# Patient Record
Sex: Male | Born: 1985 | Race: Black or African American | Hispanic: No | Marital: Single | Smoking: Current every day smoker
Health system: Southern US, Community
[De-identification: ages and names within clinical notes are randomized; demographics above are authoritative.]

## PROBLEM LIST (undated history)

## (undated) HISTORY — PX: APPENDECTOMY: SHX54

---
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
## 2020-09-29 ENCOUNTER — Emergency Department (HOSPITAL_COMMUNITY): Payer: Self-pay

## 2020-09-29 ENCOUNTER — Emergency Department (HOSPITAL_COMMUNITY)
Admission: EM | Admit: 2020-09-29 | Discharge: 2020-09-29 | Disposition: A | Discharge status: Home / Self Care | Payer: Self-pay | Attending: Emergency Medicine | Admitting: Emergency Medicine

## 2020-09-29 ENCOUNTER — Other Ambulatory Visit: Payer: Self-pay

## 2020-09-29 DIAGNOSIS — S71102A Unspecified open wound, left thigh, initial encounter: Secondary | ICD-10-CM | POA: Insufficient documentation

## 2020-09-29 DIAGNOSIS — Z20822 Contact with and (suspected) exposure to covid-19: Secondary | ICD-10-CM | POA: Insufficient documentation

## 2020-09-29 DIAGNOSIS — Z79899 Other long term (current) drug therapy: Secondary | ICD-10-CM | POA: Insufficient documentation

## 2020-09-29 DIAGNOSIS — S7292XA Unspecified fracture of left femur, initial encounter for closed fracture: Secondary | ICD-10-CM | POA: Insufficient documentation

## 2020-09-29 DIAGNOSIS — Z23 Encounter for immunization: Secondary | ICD-10-CM | POA: Insufficient documentation

## 2020-09-29 DIAGNOSIS — S81002A Unspecified open wound, left knee, initial encounter: Secondary | ICD-10-CM | POA: Insufficient documentation

## 2020-09-29 DIAGNOSIS — W3400XA Accidental discharge from unspecified firearms or gun, initial encounter: Secondary | ICD-10-CM | POA: Insufficient documentation

## 2020-09-29 DIAGNOSIS — T148XXA Other injury of unspecified body region, initial encounter: Secondary | ICD-10-CM

## 2020-09-29 LAB — RESP PANEL BY RT-PCR (FLU A&B, COVID) ARPGX2
Influenza A by PCR: NEGATIVE
Influenza B by PCR: NEGATIVE
SARS Coronavirus 2 by RT PCR: NEGATIVE

## 2020-09-29 LAB — I-STAT CHEM 8, ED
BUN: 10 mg/dL (ref 6–20)
Calcium, Ion: 1.08 mmol/L — ABNORMAL LOW (ref 1.15–1.40)
Chloride: 106 mmol/L (ref 98–111)
Creatinine, Ser: 1.1 mg/dL (ref 0.61–1.24)
Glucose, Bld: 129 mg/dL — ABNORMAL HIGH (ref 70–99)
HCT: 48 % (ref 39.0–52.0)
Hemoglobin: 16.3 g/dL (ref 13.0–17.0)
Potassium: 3.4 mmol/L — ABNORMAL LOW (ref 3.5–5.1)
Sodium: 142 mmol/L (ref 135–145)
TCO2: 23 mmol/L (ref 22–32)

## 2020-09-29 LAB — COMPREHENSIVE METABOLIC PANEL
ALT: 23 U/L (ref 0–44)
AST: 29 U/L (ref 15–41)
Albumin: 3.9 g/dL (ref 3.5–5.0)
Alkaline Phosphatase: 49 U/L (ref 38–126)
Anion gap: 14 (ref 5–15)
BUN: 9 mg/dL (ref 6–20)
CO2: 23 mmol/L (ref 22–32)
Calcium: 9.3 mg/dL (ref 8.9–10.3)
Chloride: 103 mmol/L (ref 98–111)
Creatinine, Ser: 1.17 mg/dL (ref 0.61–1.24)
GFR, Estimated: >60 mL/min (ref >60)
Glucose, Bld: 136 mg/dL — ABNORMAL HIGH (ref 70–99)
Potassium: 3.5 mmol/L (ref 3.5–5.1)
Sodium: 140 mmol/L (ref 135–145)
Total Bilirubin: 0.5 mg/dL (ref 0.3–1.2)
Total Protein: 6.6 g/dL (ref 6.5–8.1)

## 2020-09-29 LAB — CBC
HCT: 45.7 % (ref 39.0–52.0)
Hemoglobin: 14.9 g/dL (ref 13.0–17.0)
MCH: 28.1 pg (ref 26.0–34.0)
MCHC: 32.6 g/dL (ref 30.0–36.0)
MCV: 86.2 fL (ref 80.0–100.0)
Platelets: 159 10*3/uL (ref 150–400)
RBC: 5.3 MIL/uL (ref 4.22–5.81)
RDW: 14.1 % (ref 11.5–15.5)
WBC: 6.5 10*3/uL (ref 4.0–10.5)
nRBC: 0 % (ref 0.0–0.2)

## 2020-09-29 LAB — SAMPLE TO BLOOD BANK

## 2020-09-29 LAB — PROTIME-INR
INR: 1 (ref 0.8–1.2)
Prothrombin Time: 13.1 seconds (ref 11.4–15.2)

## 2020-09-29 LAB — LACTIC ACID, PLASMA
Lactic Acid, Venous: 5.7 mmol/L (ref 0.5–1.9)
Lactic Acid, Venous: 2.4 mmol/L (ref 0.5–1.9)

## 2020-09-29 LAB — ETHANOL: Alcohol, Ethyl (B): 56 mg/dL — ABNORMAL HIGH (ref <10)

## 2020-09-29 MED ORDER — CEFAZOLIN SODIUM-DEXTROSE 2-4 GM/100ML-% IV SOLN
2.0000 g | Freq: Once | INTRAVENOUS | Status: AC
  Administered 2020-09-29: 2 g via INTRAVENOUS
  Filled 2020-09-29: qty 100

## 2020-09-29 MED ORDER — FENTANYL CITRATE (PF) 100 MCG/2ML IJ SOLN
100.0000 ug | Freq: Once | INTRAMUSCULAR | Status: AC
  Administered 2020-09-29: 100 ug via INTRAVENOUS
  Filled 2020-09-29: qty 2

## 2020-09-29 MED ORDER — IOHEXOL 350 MG/ML SOLN
100.0000 mL | Freq: Once | INTRAVENOUS | Status: AC | PRN
  Administered 2020-09-29: 100 mL via INTRAVENOUS

## 2020-09-29 MED ORDER — TETANUS-DIPHTH-ACELL PERTUSSIS 5-2.5-18.5 LF-MCG/0.5 IM SUSY
0.5000 mL | PREFILLED_SYRINGE | Freq: Once | INTRAMUSCULAR | Status: AC
  Administered 2020-09-29: 0.5 mL via INTRAMUSCULAR
  Filled 2020-09-29: qty 0.5

## 2020-09-29 MED ORDER — CEPHALEXIN 500 MG PO CAPS: 500.0000 mg | ORAL_CAPSULE | Freq: Four times a day (QID) | ORAL | 0 refills | Status: DC

## 2020-09-29 MED ORDER — OXYCODONE-ACETAMINOPHEN 5-325 MG PO TABS: 1.0000 | ORAL_TABLET | Freq: Four times a day (QID) | ORAL | 0 refills | Status: DC | PRN

## 2020-09-29 MED ORDER — LACTATED RINGERS IV BOLUS
1000.0000 mL | Freq: Once | INTRAVENOUS | Status: AC
  Administered 2020-09-29: 1000 mL via INTRAVENOUS
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
## 2020-09-29 NOTE — Progress Notes (Signed)
Orthopedic Tech Progress Note Patient Details:  Jackson Gallegos Sep 21, 1985 [id number]  Ortho Devices Type of Ortho Device: Crutches, Knee Immobilizer Ortho Device/Splint Location: LLE Ortho Device/Splint Interventions: Ordered, Application, Adjustment   Post Interventions Patient Tolerated: Well Instructions Provided: Adjustment of device, Care of device, Poper ambulation with device  Arn Mcomber 09/29/2020, 6:15 AM

## 2020-09-29 NOTE — ED Notes (Addendum)
Pt back from CT scan. Pt requesting IV pain medications. MD notified
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
## 2020-09-29 NOTE — ED Notes (Signed)
Patient transported to CT w/ TRN ?
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
## 2021-07-17 ENCOUNTER — Emergency Department (HOSPITAL_COMMUNITY): Payer: Self-pay

## 2021-07-17 ENCOUNTER — Emergency Department (HOSPITAL_COMMUNITY)
Admission: EM | Admit: 2021-07-17 | Discharge: 2021-07-17 | Disposition: A | Discharge status: Home / Self Care | Payer: Self-pay | Attending: Emergency Medicine | Admitting: Emergency Medicine

## 2021-07-17 ENCOUNTER — Encounter (HOSPITAL_COMMUNITY): Payer: Self-pay | Admitting: Emergency Medicine

## 2021-07-17 ENCOUNTER — Other Ambulatory Visit: Payer: Self-pay

## 2021-07-17 DIAGNOSIS — S50311A Abrasion of right elbow, initial encounter: Secondary | ICD-10-CM | POA: Insufficient documentation

## 2021-07-17 DIAGNOSIS — S43101A Unspecified dislocation of right acromioclavicular joint, initial encounter: Secondary | ICD-10-CM | POA: Insufficient documentation

## 2021-07-17 DIAGNOSIS — W1809XA Striking against other object with subsequent fall, initial encounter: Secondary | ICD-10-CM | POA: Insufficient documentation

## 2021-07-17 DIAGNOSIS — Y9389 Activity, other specified: Secondary | ICD-10-CM | POA: Insufficient documentation

## 2021-07-17 DIAGNOSIS — R21 Rash and other nonspecific skin eruption: Secondary | ICD-10-CM | POA: Insufficient documentation

## 2021-07-17 MED ORDER — IBUPROFEN 800 MG PO TABS
800.0000 mg | ORAL_TABLET | Freq: Once | ORAL | Status: AC
Start: 2021-07-17 — End: 2021-07-17
  Administered 2021-07-17: 800 mg via ORAL
  Filled 2021-07-17: qty 1

## 2021-07-17 MED ORDER — BACITRACIN ZINC 500 UNIT/GM EX OINT
1.0000 "application " | TOPICAL_OINTMENT | Freq: Two times a day (BID) | CUTANEOUS | Status: DC
  Administered 2021-07-17: 1 via TOPICAL
  Filled 2021-07-17: qty 0.9

## 2021-07-17 MED ORDER — NAPROXEN 500 MG PO TABS: 500.0000 mg | ORAL_TABLET | Freq: Two times a day (BID) | ORAL | 0 refills | Status: DC
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
## 2021-07-17 NOTE — Discharge Instructions (Signed)
Please read the attached instructions regarding separation of the [facility] joint or a separated shoulder.  You will need to be seen by the orthopedic surgeon within the next couple of weeks if this is not gradually getting better however if it gets worse you may come back to the emergency department. ? ?Please take Naprosyn, 500mg  by mouth twice daily as needed for pain - this in an antiinflammatory medicine (NSAID) and is similar to ibuprofen - many people feel that it is stronger than ibuprofen and it is easier to take since it is a smaller pill.  Please use this only for 1 week - if your pain persists, you will need to follow up with your doctor in the office for ongoing guidance and pain control.   ? ?

## 2021-07-17 NOTE — ED Notes (Signed)
ED Provider at bedside. 
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
## 2023-01-15 ENCOUNTER — Emergency Department (HOSPITAL_COMMUNITY)
Admission: EM | Admit: 2023-01-15 | Discharge: 2023-01-15 | Disposition: A | Discharge status: Home / Self Care | Payer: Self-pay

## 2023-01-15 ENCOUNTER — Other Ambulatory Visit: Payer: Self-pay

## 2023-01-15 ENCOUNTER — Encounter (HOSPITAL_COMMUNITY): Payer: Self-pay | Admitting: Emergency Medicine

## 2023-01-15 DIAGNOSIS — K047 Periapical abscess without sinus: Secondary | ICD-10-CM | POA: Insufficient documentation

## 2023-01-15 MED ORDER — CLINDAMYCIN HCL 300 MG PO CAPS: 300.0000 mg | ORAL_CAPSULE | Freq: Three times a day (TID) | ORAL | 0 refills | Status: AC

## 2023-01-15 MED ORDER — DICLOFENAC SODIUM 75 MG PO TBEC: 75.0000 mg | DELAYED_RELEASE_TABLET | Freq: Two times a day (BID) | ORAL | 0 refills | Status: AC

## 2023-01-15 NOTE — ED Provider Notes (Signed)
[facility] EMERGENCY DEPARTMENT AT [facility] Provider Note   CSN: [id number] Arrival date & time: 01/15/23  1145     History  Chief Complaint  Patient presents with   Dental Pain    Jackson Gallegos is a 37 y.o. male.  Patient complains of pain in his upper and lower third molar area.  Patient reports he has swelling over his teeth and both areas are causing him pain.  Patient does not have a dentist.  Patient denies any allergies . He has not had any medical problems she complains of pain with eating  The history is provided by the patient. No language interpreter was used.  Dental Pain      Home Medications Prior to Admission medications   Medication Sig Start Date End Date Taking? Authorizing Provider  clindamycin (CLEOCIN) 300 MG capsule Take 1 capsule (300 mg total) by mouth 3 (three) times daily for 10 days. 01/15/23 01/25/23 Yes Elson Areas, PA-C  diclofenac (VOLTAREN) 75 MG EC tablet Take 1 tablet (75 mg total) by mouth 2 (two) times daily. 01/15/23  Yes Elson Areas, PA-C      Allergies    Patient has no known allergies.    Review of Systems   Review of Systems  All other systems reviewed and are negative.   Physical Exam Updated Vital Signs BP (!) 161/98   Pulse 68   Temp 99.3 F (37.4 C) (Oral)   Resp 18   Ht 6\' 3"  (1.905 m)   Wt 107.5 kg   SpO2 100%   BMI 29.62 kg/m  Physical Exam Vitals and nursing note reviewed.  Constitutional:      Appearance: He is well-developed.  HENT:     Head: Normocephalic.     Mouth/Throat:     Mouth: Mucous membranes are moist.     Comments: Swelling left lower and left upper third molar, impacted left lower third molar. Cardiovascular:     Rate and Rhythm: Normal rate.  Pulmonary:     Effort: Pulmonary effort is normal.  Abdominal:     General: There is no distension.  Musculoskeletal:        General: Normal range of motion.  Skin:    General: Skin is warm.  Neurological:     General: No  focal deficit present.     Mental Status: He is alert and oriented to person, place, and time.  Psychiatric:        Mood and Affect: Mood normal.     ED Results / Procedures / Treatments   Labs (all labs ordered are listed, but only abnormal results are displayed) Labs Reviewed - No data to display  EKG None  Radiology No results found.  Procedures Procedures    Medications Ordered in ED Medications - No data to display  ED Course/ Medical Decision Making/ A&P                                 Medical Decision Making Patient complains of pain in his left upper and left lower third molar area  Risk Prescription drug management. Risk Details: Patient given prescription for clindamycin and Voltaren.  Patient is given the phone number for dentist for follow-up.           Final Clinical Impression(s) / ED Diagnoses Final diagnoses:  Dental infection    Rx / DC Orders ED Discharge Orders  Ordered    clindamycin (CLEOCIN) 300 MG capsule  3 times daily        01/15/23 1437    diclofenac (VOLTAREN) 75 MG EC tablet  2 times daily        01/15/23 1437           An After Visit Summary was printed and given to the patient.    Elson Areas, PA-C 01/15/23 1441    Coral Spikes, DO 01/23/23 1517

## 2023-01-15 NOTE — Discharge Instructions (Addendum)
Return if any problems.  Schedule dental evaluation.

## 2023-01-15 NOTE — ED Triage Notes (Signed)
Pt presents with dental caries to left upper and lower teeth.
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
